# Patient Record
Sex: Male | Born: 1975 | Race: White | Hispanic: No | Marital: Single
Health system: Southern US, Community
[De-identification: ages and names within clinical notes are randomized; demographics above are authoritative.]

---

## 2007-12-12 ENCOUNTER — Ambulatory Visit: Payer: Self-pay | Admitting: Cardiology

## 2007-12-18 ENCOUNTER — Ambulatory Visit: Payer: Self-pay

## 2007-12-18 ENCOUNTER — Ambulatory Visit: Payer: Self-pay | Admitting: Cardiology

## 2011-02-27 NOTE — Procedures (Signed)
[name]                              EXERCISE TREADMILL   NAME:[name], [name]                          MRN:          [id number]  DATE:12/18/2007                            DOB:          [birth year]    [name] is a 35 year old gentleman I recently evaluated for atypical  chest pain.  This study is performed for risk stratification.   The patient exercised for a duration of 18 minutes and 7 seconds on the  [name] protocol, which is equivalent to 20.5 METs.  His heart rate  increased from a resting of 55 to a maximum of 179, which is 94% of his  predicted maximum.  His blood pressure rest is 126/78 and increased to  189/104, which is felt to be a hypertensive response.  The patient had  brief chest pain while walking, but while running  the pain resolved and  he had no further symptoms.  There were no electrocardiographic changes.  The study was terminated secondary to fatigue.   FINAL INTERPRETATION:  The exercise treadmill with mild chest discomfort  in the walking stages but no chest pain at peak exertion.  There were no  electrocardiographic changes.  The patient demonstrated excellent  exercise tolerance.  Of note, there was a hypertensive response.  This  would suggest that his pain is not cardiac and would be a low risk  study.     Madolyn Frieze Jens Som, MD, [facility]  Electronically Signed    BSC/MedQ  DD: 12/18/2007  DT: 12/18/2007  Job #: [id number]   cc:   Kristian Covey, PAC

## 2011-02-27 NOTE — Assessment & Plan Note (Signed)
[facility]                            CARDIOLOGY OFFICE NOTE   NAME:Roman, Roman                          MRN:          [id number]  DATE:12/12/2007                            DOB:          12/02/1975    HISTORY:  The patient is a 35 year old male who I am asked to evaluate  for chest pain.  He has no prior cardiac history.  He is in excellent  shape and typically exercises 2-3 times per week.  He also coaches  soccer and track.  He typically does not have dyspnea on exertion,  orthopnea, PND, pedal edema, palpitations, presyncope, syncope or chest  pain.  On the 15th of this month the patient participated in a 6 mile  run.  During that run he developed a cramping sensation in the left  axillary/lateral chest area.  The pain was not positional nor is it  pleuritic.  It was not related to food.  It is not clearly exertional.  The pain has been intermittent since that time.  He does state that he  ran around his track at school for half lap and the pain returned.  However, recently he ran through his neighborhood and had no pain.  He  does state that for the past 2 days it has been almost persistent to a  mild degree.  He feels that something is there.  There is no  associated nausea, vomiting, shortness of breath or diaphoresis.  The  pain does not radiate.  Again, he describes it as a cramping sensation.  Because of the above we were asked to further evaluate.   MEDICATIONS:  He is on no medications.   ALLERGIES:  He has no known drug allergies.   SOCIAL HISTORY:  He does not smoke.  He occasionally consumes alcohol.  He denies any cocaine use.   FAMILY HISTORY:  Is significant for WPW.  He also states his sister has  PACs.  There is no coronary artery disease in the immediate family.   PAST MEDICAL HISTORY:  There is no diabetes mellitus, hypertension or  hyperlipidemia.  There are no other medical issues noted.  There are no  surgeries.   REVIEW OF SYSTEMS:  He denies any headaches or fevers or chills.  There  is no productive cough or hemoptysis.  There is no dysphagia,  odynophagia, melena or hematochezia.  There is no dysuria or hematuria.  There is no rash or seizure active.  There is no orthopnea, PND or pedal  edema.  The remaining systems are negative.   PHYSICAL EXAMINATION:  VITAL SIGNS:  Today shows a blood pressure of  129/86.  His pulse is 62.  He weighs 136 pounds.  GENERAL:  He is well-developed and well-nourished and in no acute  distress.  SKIN:  Skin is warm and dry.  He does not appear to be depressed.  There  is no peripheral clubbing.  BACK:  Normal.  HEENT:  Is normal with normal eyelids.  NECK:  Neck is supple with normal upstroke bilaterally.  No bruits  noted.  There is no jugular venous distention.  No thyromegaly.  CHEST:  His chest is clear to auscultation with normal expansion.  CARDIOVASCULAR:  Regular rate and rhythm.  Normal S1 and S2.  There are  no murmurs, rubs or gallops noted.  He does have a tattoo on his right  shoulder and right back.  ABDOMEN:  Nontender, nondistended.  Positive bowel sounds.  No  hepatosplenomegaly and no masses appreciated.  There is no abdominal  bruit.  He has 2+ femoral pulses bilaterally.  No bruits.  EXTREMITIES:  Show no edema.  I could palpate no cords.  He has 2+  dorsalis pedis pulses bilaterally.  NEUROLOGICAL:  Grossly intact.   I have an electrocardiogram from December 04, 2007, that showed sinus  bradycardia with no ST changes.  Today's electrocardiogram shows sinus  rhythm at a rate of 63.  The axis is normal.  There are no ST changes  noted.   DIAGNOSES:  1. Atypical chest pain - [name] presents for evaluation of chest      pain.  His symptoms are very atypical and may be musculoskeletal in      etiology.  However, he is very concerned about these.  We will plan      to proceed with an exercise treadmill for evaluation.  I think if       this is unremarkable then we would not need to pursue further      ischemia evaluation.  If his symptoms persist it may be worthwhile      to try a course of nonsteroidals.  Regardless, I have asked him to      follow up with his primary care physician for further evaluation if      the symptoms persist.  2. Risk factor modification - the patient does not smoke.  We have      discussed the importance of diet and exercise and he is already      doing well with this.  We will see him back on an as-needed basis.     Madolyn Frieze Jens Som, MD, [facility]  Electronically Signed    BSC/MedQ  DD: 12/12/2007  DT: 12/12/2007  Job #: [id number]   cc:   Kristian Covey, PAC

## 2011-05-21 ENCOUNTER — Encounter (INDEPENDENT_AMBULATORY_CARE_PROVIDER_SITE_OTHER): Payer: BC Managed Care – PPO | Admitting: Ophthalmology

## 2011-05-21 DIAGNOSIS — D3130 Benign neoplasm of unspecified choroid: Secondary | ICD-10-CM

## 2011-05-21 DIAGNOSIS — H43819 Vitreous degeneration, unspecified eye: Secondary | ICD-10-CM

## 2011-11-19 ENCOUNTER — Ambulatory Visit (INDEPENDENT_AMBULATORY_CARE_PROVIDER_SITE_OTHER): Payer: BC Managed Care – PPO | Admitting: Ophthalmology

## 2011-11-19 DIAGNOSIS — H5210 Myopia, unspecified eye: Secondary | ICD-10-CM

## 2011-11-19 DIAGNOSIS — D3130 Benign neoplasm of unspecified choroid: Secondary | ICD-10-CM

## 2011-11-19 DIAGNOSIS — H43819 Vitreous degeneration, unspecified eye: Secondary | ICD-10-CM

## 2012-05-19 ENCOUNTER — Ambulatory Visit (INDEPENDENT_AMBULATORY_CARE_PROVIDER_SITE_OTHER): Payer: BC Managed Care – PPO | Admitting: Ophthalmology

## 2012-05-19 DIAGNOSIS — H43819 Vitreous degeneration, unspecified eye: Secondary | ICD-10-CM

## 2012-05-19 DIAGNOSIS — D3130 Benign neoplasm of unspecified choroid: Secondary | ICD-10-CM

## 2012-11-19 ENCOUNTER — Ambulatory Visit (INDEPENDENT_AMBULATORY_CARE_PROVIDER_SITE_OTHER): Payer: BC Managed Care – PPO | Admitting: Ophthalmology

## 2012-11-19 DIAGNOSIS — H5210 Myopia, unspecified eye: Secondary | ICD-10-CM

## 2012-11-19 DIAGNOSIS — D3130 Benign neoplasm of unspecified choroid: Secondary | ICD-10-CM

## 2012-11-19 DIAGNOSIS — H43819 Vitreous degeneration, unspecified eye: Secondary | ICD-10-CM

## 2013-08-19 ENCOUNTER — Ambulatory Visit (INDEPENDENT_AMBULATORY_CARE_PROVIDER_SITE_OTHER): Payer: BC Managed Care – PPO | Admitting: Ophthalmology

## 2013-08-19 DIAGNOSIS — H43819 Vitreous degeneration, unspecified eye: Secondary | ICD-10-CM

## 2013-08-19 DIAGNOSIS — D3130 Benign neoplasm of unspecified choroid: Secondary | ICD-10-CM

## 2014-05-19 ENCOUNTER — Ambulatory Visit (INDEPENDENT_AMBULATORY_CARE_PROVIDER_SITE_OTHER): Payer: BC Managed Care – PPO | Admitting: Ophthalmology

## 2014-05-19 DIAGNOSIS — D3130 Benign neoplasm of unspecified choroid: Secondary | ICD-10-CM

## 2014-05-19 DIAGNOSIS — H5210 Myopia, unspecified eye: Secondary | ICD-10-CM

## 2014-05-19 DIAGNOSIS — H43819 Vitreous degeneration, unspecified eye: Secondary | ICD-10-CM

## 2014-05-19 DIAGNOSIS — H35369 Drusen (degenerative) of macula, unspecified eye: Secondary | ICD-10-CM

## 2015-02-23 ENCOUNTER — Ambulatory Visit (INDEPENDENT_AMBULATORY_CARE_PROVIDER_SITE_OTHER): Payer: BC Managed Care – PPO | Admitting: Ophthalmology

## 2015-02-23 DIAGNOSIS — D3132 Benign neoplasm of left choroid: Secondary | ICD-10-CM

## 2015-02-23 DIAGNOSIS — H43813 Vitreous degeneration, bilateral: Secondary | ICD-10-CM

## 2015-11-28 ENCOUNTER — Ambulatory Visit (INDEPENDENT_AMBULATORY_CARE_PROVIDER_SITE_OTHER): Payer: BC Managed Care – PPO | Admitting: Ophthalmology

## 2015-11-28 DIAGNOSIS — D3132 Benign neoplasm of left choroid: Secondary | ICD-10-CM | POA: Diagnosis not present

## 2015-11-28 DIAGNOSIS — H43813 Vitreous degeneration, bilateral: Secondary | ICD-10-CM

## 2016-08-27 ENCOUNTER — Ambulatory Visit (INDEPENDENT_AMBULATORY_CARE_PROVIDER_SITE_OTHER): Payer: BC Managed Care – PPO | Admitting: Ophthalmology

## 2016-08-27 DIAGNOSIS — H2512 Age-related nuclear cataract, left eye: Secondary | ICD-10-CM | POA: Diagnosis not present

## 2016-08-27 DIAGNOSIS — H35721 Serous detachment of retinal pigment epithelium, right eye: Secondary | ICD-10-CM | POA: Diagnosis not present

## 2016-08-27 DIAGNOSIS — H43813 Vitreous degeneration, bilateral: Secondary | ICD-10-CM | POA: Diagnosis not present

## 2016-08-27 DIAGNOSIS — D3132 Benign neoplasm of left choroid: Secondary | ICD-10-CM

## 2017-05-27 ENCOUNTER — Ambulatory Visit (INDEPENDENT_AMBULATORY_CARE_PROVIDER_SITE_OTHER): Payer: BC Managed Care – PPO | Admitting: Ophthalmology

## 2017-05-27 DIAGNOSIS — H5213 Myopia, bilateral: Secondary | ICD-10-CM

## 2017-05-27 DIAGNOSIS — D3132 Benign neoplasm of left choroid: Secondary | ICD-10-CM

## 2017-05-27 DIAGNOSIS — H43813 Vitreous degeneration, bilateral: Secondary | ICD-10-CM

## 2017-05-27 DIAGNOSIS — H35721 Serous detachment of retinal pigment epithelium, right eye: Secondary | ICD-10-CM

## 2018-01-29 ENCOUNTER — Other Ambulatory Visit: Payer: Self-pay | Admitting: Family Medicine

## 2018-01-29 DIAGNOSIS — M25551 Pain in right hip: Secondary | ICD-10-CM

## 2018-01-31 ENCOUNTER — Other Ambulatory Visit: Payer: BC Managed Care – PPO

## 2018-02-03 ENCOUNTER — Other Ambulatory Visit: Payer: BC Managed Care – PPO

## 2018-02-05 ENCOUNTER — Ambulatory Visit
Admission: RE | Admit: 2018-02-05 | Discharge: 2018-02-05 | Disposition: A | Discharge status: Home / Self Care | Payer: BC Managed Care – PPO | Source: Ambulatory Visit | Attending: Family Medicine | Admitting: Family Medicine

## 2018-02-05 DIAGNOSIS — M25551 Pain in right hip: Secondary | ICD-10-CM

## 2018-02-24 ENCOUNTER — Encounter (INDEPENDENT_AMBULATORY_CARE_PROVIDER_SITE_OTHER): Payer: BC Managed Care – PPO | Admitting: Ophthalmology

## 2018-02-24 DIAGNOSIS — H5213 Myopia, bilateral: Secondary | ICD-10-CM

## 2018-02-24 DIAGNOSIS — H43813 Vitreous degeneration, bilateral: Secondary | ICD-10-CM

## 2018-02-24 DIAGNOSIS — D3132 Benign neoplasm of left choroid: Secondary | ICD-10-CM | POA: Diagnosis not present

## 2018-09-11 ENCOUNTER — Encounter (HOSPITAL_COMMUNITY): Payer: Self-pay | Admitting: Emergency Medicine

## 2018-09-11 ENCOUNTER — Other Ambulatory Visit: Payer: Self-pay

## 2018-09-11 ENCOUNTER — Emergency Department (HOSPITAL_COMMUNITY): Payer: BC Managed Care – PPO

## 2018-09-11 ENCOUNTER — Emergency Department (HOSPITAL_COMMUNITY)
Admission: EM | Admit: 2018-09-11 | Discharge: 2018-09-11 | Disposition: A | Discharge status: Home / Self Care | Payer: BC Managed Care – PPO | Attending: Emergency Medicine | Admitting: Emergency Medicine

## 2018-09-11 DIAGNOSIS — Y9389 Activity, other specified: Secondary | ICD-10-CM | POA: Insufficient documentation

## 2018-09-11 DIAGNOSIS — R718 Other abnormality of red blood cells: Secondary | ICD-10-CM | POA: Insufficient documentation

## 2018-09-11 DIAGNOSIS — W228XXA Striking against or struck by other objects, initial encounter: Secondary | ICD-10-CM | POA: Diagnosis not present

## 2018-09-11 DIAGNOSIS — S0990XA Unspecified injury of head, initial encounter: Secondary | ICD-10-CM | POA: Diagnosis present

## 2018-09-11 DIAGNOSIS — Y999 Unspecified external cause status: Secondary | ICD-10-CM | POA: Diagnosis not present

## 2018-09-11 DIAGNOSIS — R51 Headache: Secondary | ICD-10-CM | POA: Diagnosis not present

## 2018-09-11 DIAGNOSIS — Y929 Unspecified place or not applicable: Secondary | ICD-10-CM | POA: Insufficient documentation

## 2018-09-11 DIAGNOSIS — F0781 Postconcussional syndrome: Secondary | ICD-10-CM | POA: Diagnosis not present

## 2018-09-11 DIAGNOSIS — R799 Abnormal finding of blood chemistry, unspecified: Secondary | ICD-10-CM

## 2018-09-11 LAB — CBC WITH DIFFERENTIAL/PLATELET
ABS IMMATURE GRANULOCYTES: 0.02 10*3/uL (ref 0.00–0.07)
BASOS PCT: 1 %
Basophils Absolute: 0.1 10*3/uL (ref 0.0–0.1)
EOS ABS: 0.2 10*3/uL (ref 0.0–0.5)
Eosinophils Relative: 3 %
HEMATOCRIT: 46.9 % (ref 39.0–52.0)
HEMOGLOBIN: 14.4 g/dL (ref 13.0–17.0)
Immature Granulocytes: 0 %
Lymphocytes Relative: 30 %
Lymphs Abs: 1.8 10*3/uL (ref 0.7–4.0)
MCH: 19.7 pg — AB (ref 26.0–34.0)
MCHC: 30.7 g/dL (ref 30.0–36.0)
MCV: 64.2 fL — ABNORMAL LOW (ref 80.0–100.0)
Monocytes Absolute: 0.6 10*3/uL (ref 0.1–1.0)
Monocytes Relative: 10 %
NEUTROS PCT: 56 %
Neutro Abs: 3.3 10*3/uL (ref 1.7–7.7)
Platelets: 229 10*3/uL (ref 150–400)
RBC: 7.3 MIL/uL — ABNORMAL HIGH (ref 4.22–5.81)
RDW: 16.7 % — ABNORMAL HIGH (ref 11.5–15.5)
WBC: 5.9 10*3/uL (ref 4.0–10.5)
nRBC: 0 % (ref 0.0–0.2)

## 2018-09-11 LAB — COMPREHENSIVE METABOLIC PANEL
ALBUMIN: 5.1 g/dL — AB (ref 3.5–5.0)
ALK PHOS: 43 U/L (ref 38–126)
ALT: 26 U/L (ref 0–44)
AST: 31 U/L (ref 15–41)
Anion gap: 7 (ref 5–15)
BUN: 15 mg/dL (ref 6–20)
CALCIUM: 9.9 mg/dL (ref 8.9–10.3)
CO2: 29 mmol/L (ref 22–32)
CREATININE: 1.07 mg/dL (ref 0.61–1.24)
Chloride: 104 mmol/L (ref 98–111)
GFR calc non Af Amer: >60 mL/min (ref >60)
GLUCOSE: 109 mg/dL — AB (ref 70–99)
Potassium: 3.9 mmol/L (ref 3.5–5.1)
SODIUM: 140 mmol/L (ref 135–145)
TOTAL PROTEIN: 7.4 g/dL (ref 6.5–8.1)
Total Bilirubin: 1.1 mg/dL (ref 0.3–1.2)

## 2018-09-11 LAB — TECHNOLOGIST SMEAR REVIEW

## 2018-09-11 MED ORDER — MECLIZINE HCL 25 MG PO TABS: 25.0000 mg | ORAL_TABLET | Freq: Three times a day (TID) | ORAL | 0 refills | Status: AC | PRN

## 2018-09-11 NOTE — ED Notes (Signed)
Lab to add on latest order for "technologist smear review"
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
## 2018-09-11 NOTE — ED Notes (Signed)
Patient transported to CT 

## 2018-09-11 NOTE — Discharge Instructions (Addendum)
Try meclizine for dizziness as prescribed as needed. Drink plenty of fluids. Rest. Follow up with primary care doctor in 1 week.

## 2018-09-11 NOTE — ED Notes (Signed)
ED Provider at bedside. 
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
## 2018-12-01 ENCOUNTER — Encounter (INDEPENDENT_AMBULATORY_CARE_PROVIDER_SITE_OTHER): Payer: BC Managed Care – PPO | Admitting: Ophthalmology

## 2018-12-01 DIAGNOSIS — H5213 Myopia, bilateral: Secondary | ICD-10-CM

## 2018-12-01 DIAGNOSIS — D3132 Benign neoplasm of left choroid: Secondary | ICD-10-CM | POA: Diagnosis not present

## 2018-12-01 DIAGNOSIS — H2513 Age-related nuclear cataract, bilateral: Secondary | ICD-10-CM | POA: Diagnosis not present

## 2018-12-01 DIAGNOSIS — H43813 Vitreous degeneration, bilateral: Secondary | ICD-10-CM | POA: Diagnosis not present

## 2019-09-02 ENCOUNTER — Encounter (INDEPENDENT_AMBULATORY_CARE_PROVIDER_SITE_OTHER): Payer: BC Managed Care – PPO | Admitting: Ophthalmology

## 2019-09-02 ENCOUNTER — Other Ambulatory Visit: Payer: Self-pay

## 2019-09-02 DIAGNOSIS — D3132 Benign neoplasm of left choroid: Secondary | ICD-10-CM

## 2019-09-02 DIAGNOSIS — H353112 Nonexudative age-related macular degeneration, right eye, intermediate dry stage: Secondary | ICD-10-CM | POA: Diagnosis not present

## 2019-09-02 DIAGNOSIS — H43813 Vitreous degeneration, bilateral: Secondary | ICD-10-CM

## 2020-01-12 IMAGING — CT CT HEAD W/O CM
4 series · 16 of 47 positions shown, 18 images · non-contrast
Comparison: None.

CLINICAL DATA: Head injury. ETOH. headache.

EXAM:
CT HEAD WITHOUT CONTRAST
TECHNIQUE: Contiguous axial images were obtained from the base of the skull
through the vertex without intravenous contrast.

[Series 3: head wo · axial · 0.43mm/px · z∈[-126,-6]mm · 7 of 33 slices shown, 9 images]
[im 5/33  brain]
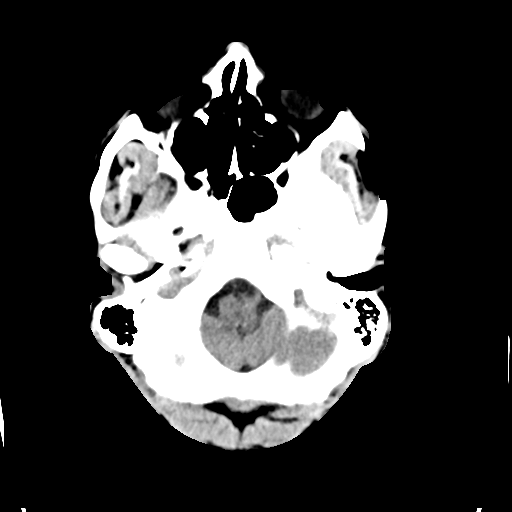
[im 5/33  bone]
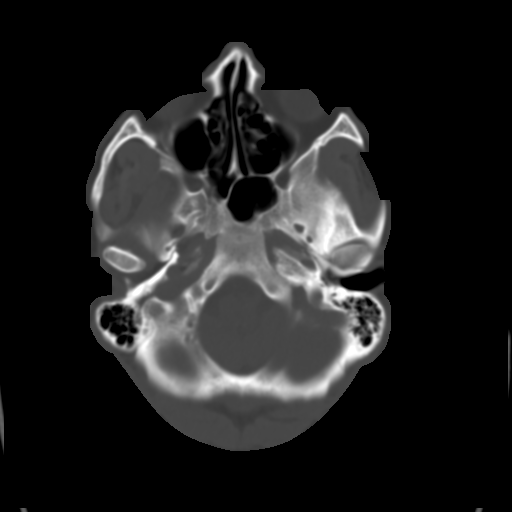
[im 9/33  brain]
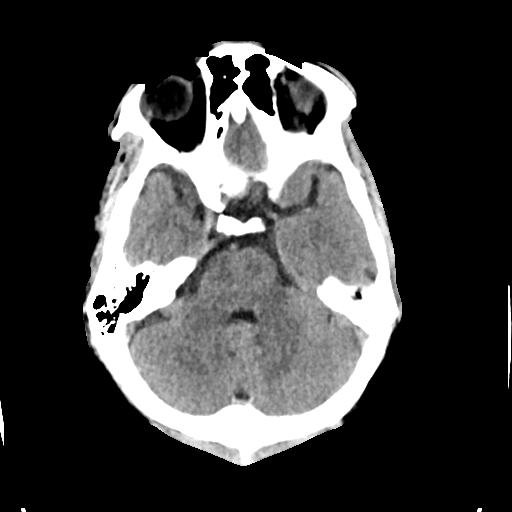
[im 13/33  brain]
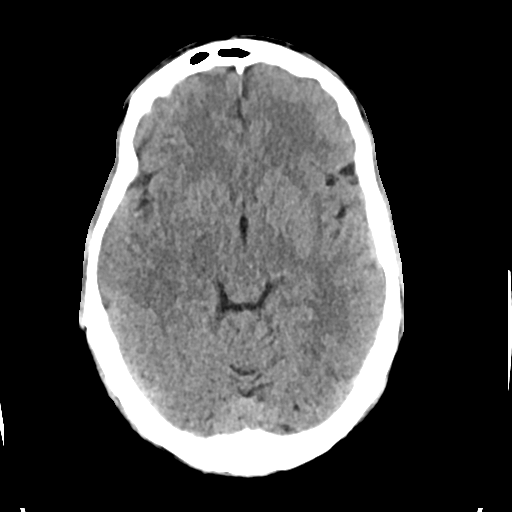
[im 17/33  brain]
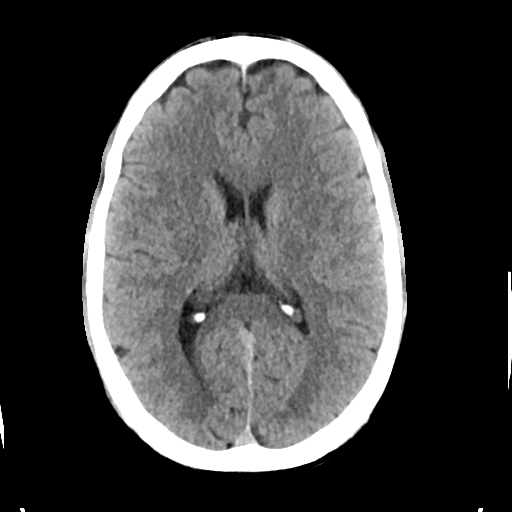
[im 21/33  brain]
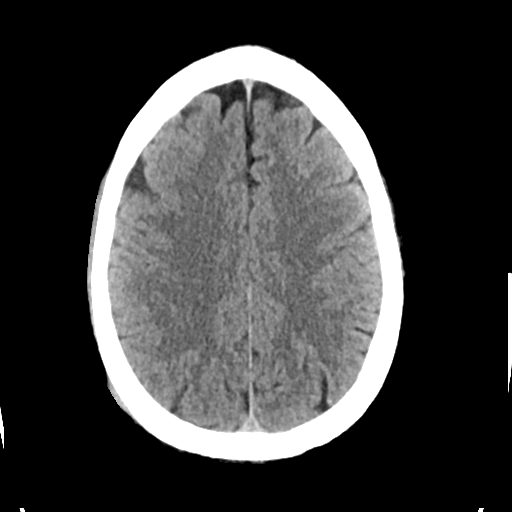
[im 21/33  bone]
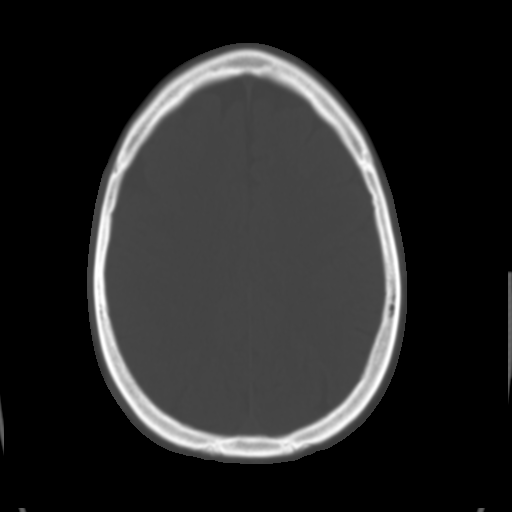
[im 25/33  brain]
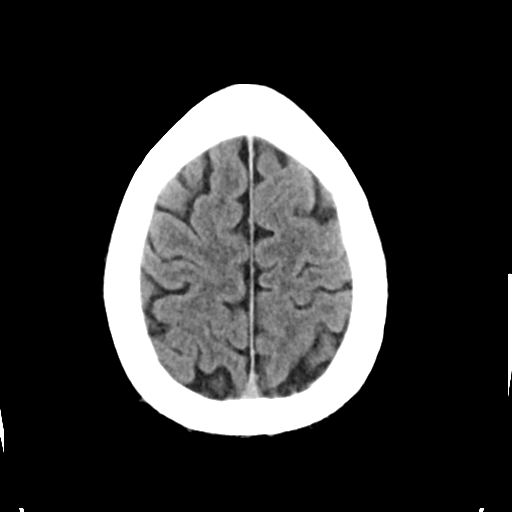
[im 29/33  brain]
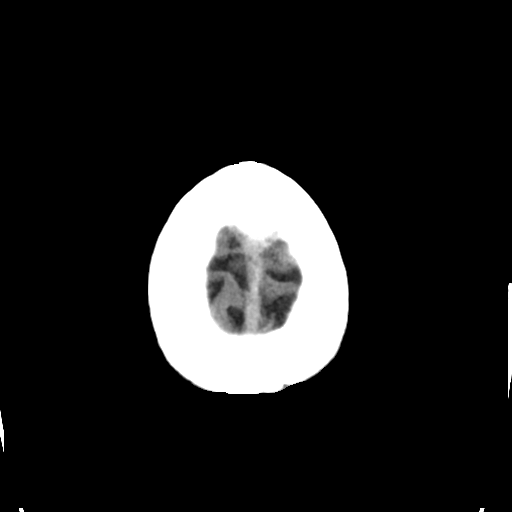

[Series 4: head bone · axial · 0.43mm/px · z∈[-130,-98]mm · 3 of 83 slices shown]
[im 9/83  bone]
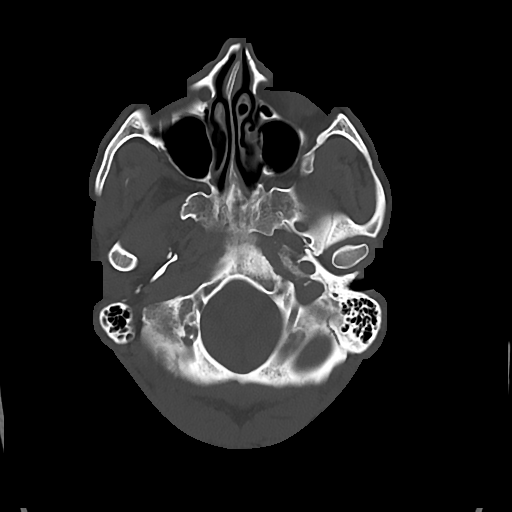
[im 17/83  bone]
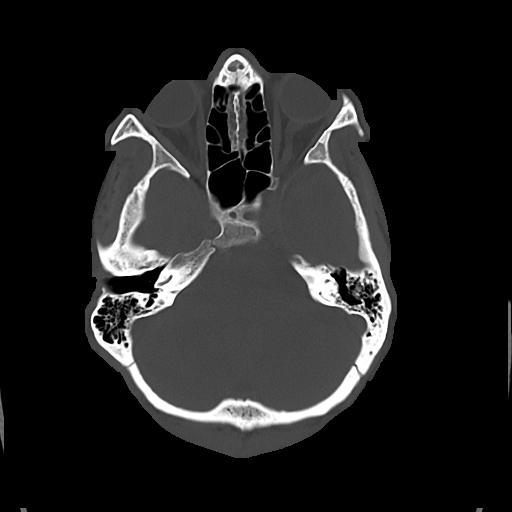
[im 25/83  bone]
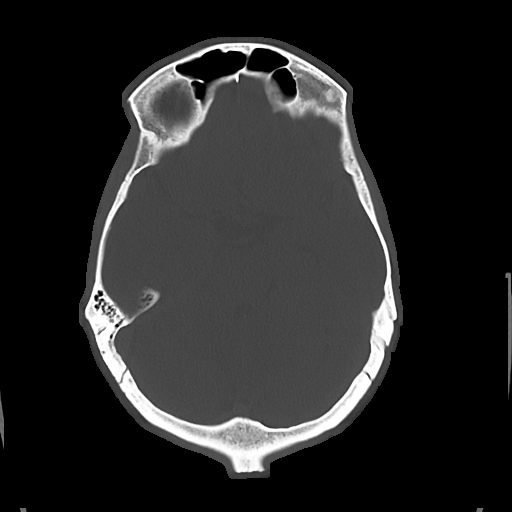

[Series 5: cor soft · coronal · 0.32mm/px · 3 of 67 slices shown]
[im 23/67  brain]
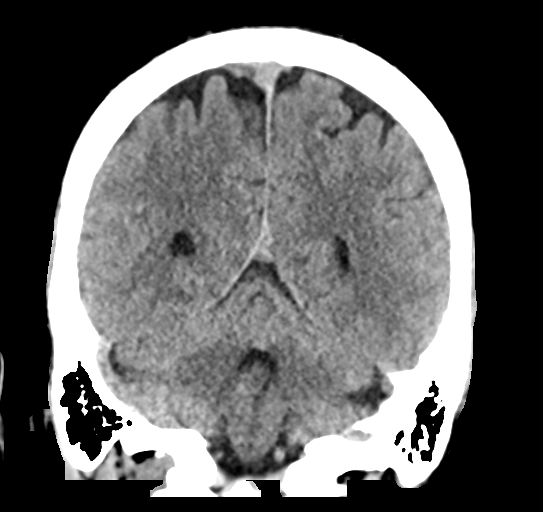
[im 30/67  brain]
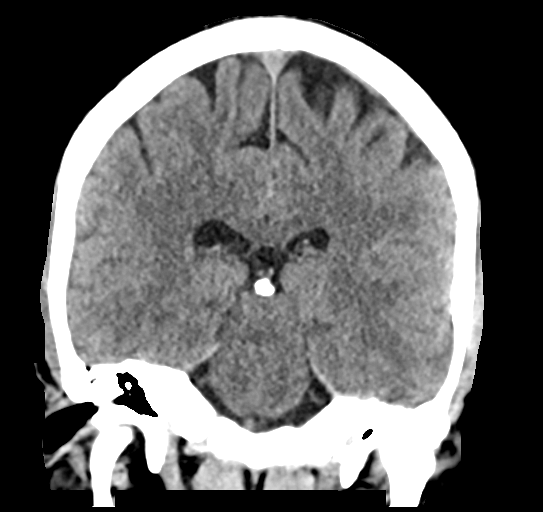
[im 37/67  brain]
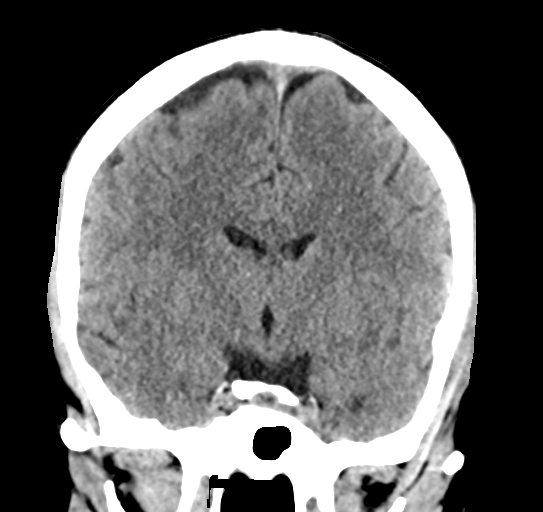

[Series 6: sag soft · sagittal · 0.32mm/px · 3 of 57 slices shown]
[im 19/57  brain]
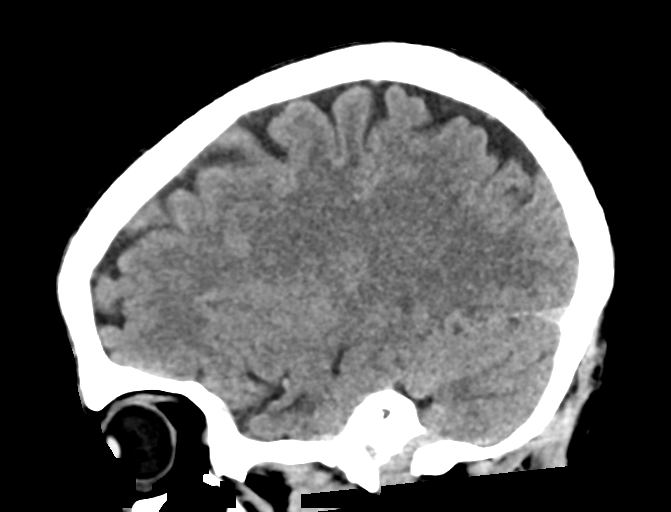
[im 29/57  brain]
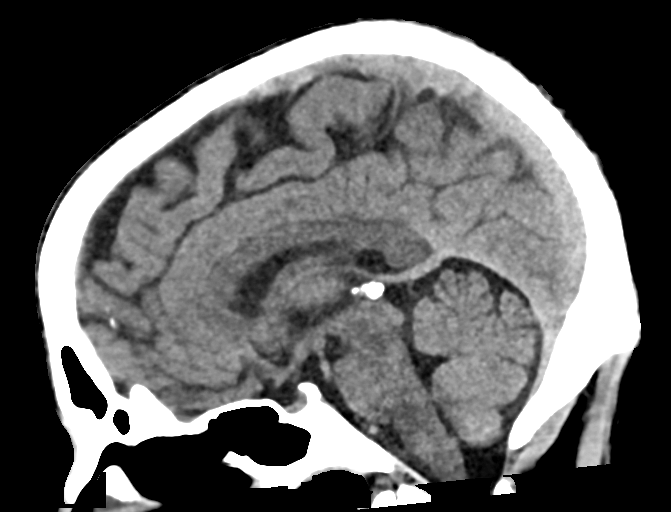
[im 38/57  brain]
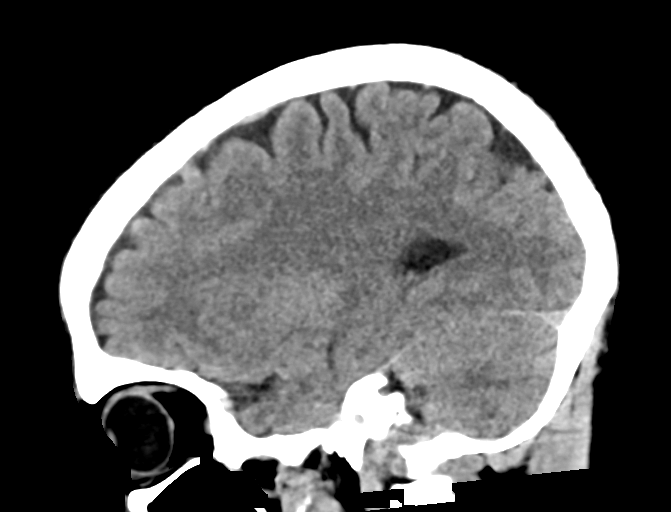

[16 of 47 positions shown; findings below may reference images not displayed]

FINDINGS: Brain: Ventricles are normal in size and configuration. There is no
mass, hemorrhage, edema or other evidence of acute parenchymal
abnormality. No extra-axial hemorrhage.

Vascular: No hyperdense vessel or unexpected calcification.

Skull: Normal. Negative for fracture or focal lesion.

Sinuses/Orbits: No acute finding.

Other: Focal scalp edema overlying the RIGHT posterior
parietal-occipital bone. No underlying fracture.
IMPRESSION: 1. Focal scalp edema overlying the RIGHT posterior
parietal-occipital bone. No underlying fracture.
2. No acute intracranial abnormality. No intracranial hemorrhage or
edema.

## 2020-05-25 ENCOUNTER — Other Ambulatory Visit: Payer: Self-pay

## 2020-05-25 ENCOUNTER — Encounter (INDEPENDENT_AMBULATORY_CARE_PROVIDER_SITE_OTHER): Payer: BC Managed Care – PPO | Admitting: Ophthalmology

## 2020-05-25 DIAGNOSIS — H2511 Age-related nuclear cataract, right eye: Secondary | ICD-10-CM | POA: Diagnosis not present

## 2020-05-25 DIAGNOSIS — D3132 Benign neoplasm of left choroid: Secondary | ICD-10-CM | POA: Diagnosis not present

## 2020-05-25 DIAGNOSIS — H43813 Vitreous degeneration, bilateral: Secondary | ICD-10-CM | POA: Diagnosis not present

## 2020-05-25 DIAGNOSIS — H353112 Nonexudative age-related macular degeneration, right eye, intermediate dry stage: Secondary | ICD-10-CM

## 2021-02-14 ENCOUNTER — Encounter (INDEPENDENT_AMBULATORY_CARE_PROVIDER_SITE_OTHER): Payer: BC Managed Care – PPO | Admitting: Ophthalmology

## 2021-02-22 ENCOUNTER — Other Ambulatory Visit: Payer: Self-pay

## 2021-02-22 ENCOUNTER — Encounter (INDEPENDENT_AMBULATORY_CARE_PROVIDER_SITE_OTHER): Payer: BC Managed Care – PPO | Admitting: Ophthalmology

## 2021-02-22 DIAGNOSIS — H353112 Nonexudative age-related macular degeneration, right eye, intermediate dry stage: Secondary | ICD-10-CM | POA: Diagnosis not present

## 2021-02-22 DIAGNOSIS — H353121 Nonexudative age-related macular degeneration, left eye, early dry stage: Secondary | ICD-10-CM | POA: Diagnosis not present

## 2021-02-22 DIAGNOSIS — D3132 Benign neoplasm of left choroid: Secondary | ICD-10-CM

## 2021-02-22 DIAGNOSIS — H43813 Vitreous degeneration, bilateral: Secondary | ICD-10-CM

## 2021-02-22 DIAGNOSIS — H2513 Age-related nuclear cataract, bilateral: Secondary | ICD-10-CM

## 2021-11-27 ENCOUNTER — Encounter (INDEPENDENT_AMBULATORY_CARE_PROVIDER_SITE_OTHER): Payer: BC Managed Care – PPO | Admitting: Ophthalmology

## 2021-12-04 ENCOUNTER — Other Ambulatory Visit: Payer: Self-pay

## 2021-12-04 ENCOUNTER — Encounter (INDEPENDENT_AMBULATORY_CARE_PROVIDER_SITE_OTHER): Payer: BC Managed Care – PPO | Admitting: Ophthalmology

## 2021-12-04 DIAGNOSIS — H43813 Vitreous degeneration, bilateral: Secondary | ICD-10-CM

## 2021-12-04 DIAGNOSIS — D3132 Benign neoplasm of left choroid: Secondary | ICD-10-CM

## 2021-12-04 DIAGNOSIS — H353131 Nonexudative age-related macular degeneration, bilateral, early dry stage: Secondary | ICD-10-CM

## 2021-12-04 DIAGNOSIS — H2513 Age-related nuclear cataract, bilateral: Secondary | ICD-10-CM | POA: Diagnosis not present

## 2022-09-05 ENCOUNTER — Encounter (INDEPENDENT_AMBULATORY_CARE_PROVIDER_SITE_OTHER): Payer: BC Managed Care – PPO | Admitting: Ophthalmology

## 2022-09-05 DIAGNOSIS — H353131 Nonexudative age-related macular degeneration, bilateral, early dry stage: Secondary | ICD-10-CM | POA: Diagnosis not present

## 2022-09-05 DIAGNOSIS — D3132 Benign neoplasm of left choroid: Secondary | ICD-10-CM | POA: Diagnosis not present

## 2022-09-05 DIAGNOSIS — H43813 Vitreous degeneration, bilateral: Secondary | ICD-10-CM

## 2023-05-22 ENCOUNTER — Encounter (INDEPENDENT_AMBULATORY_CARE_PROVIDER_SITE_OTHER): Payer: BC Managed Care – PPO | Admitting: Ophthalmology

## 2023-05-22 DIAGNOSIS — H353132 Nonexudative age-related macular degeneration, bilateral, intermediate dry stage: Secondary | ICD-10-CM | POA: Diagnosis not present

## 2023-05-22 DIAGNOSIS — H43813 Vitreous degeneration, bilateral: Secondary | ICD-10-CM | POA: Diagnosis not present

## 2023-05-22 DIAGNOSIS — D3132 Benign neoplasm of left choroid: Secondary | ICD-10-CM

## 2023-05-24 ENCOUNTER — Institutional Professional Consult (permissible substitution) (INDEPENDENT_AMBULATORY_CARE_PROVIDER_SITE_OTHER): Payer: BC Managed Care – PPO | Admitting: Otolaryngology

## 2024-03-30 ENCOUNTER — Encounter (INDEPENDENT_AMBULATORY_CARE_PROVIDER_SITE_OTHER): Payer: BC Managed Care – PPO | Admitting: Ophthalmology

## 2024-03-30 DIAGNOSIS — H43813 Vitreous degeneration, bilateral: Secondary | ICD-10-CM

## 2024-03-30 DIAGNOSIS — D3132 Benign neoplasm of left choroid: Secondary | ICD-10-CM

## 2024-03-30 DIAGNOSIS — H353131 Nonexudative age-related macular degeneration, bilateral, early dry stage: Secondary | ICD-10-CM

## 2024-04-07 ENCOUNTER — Inpatient Hospital Stay: Payer: Self-pay

## 2024-04-07 ENCOUNTER — Inpatient Hospital Stay: Payer: Self-pay | Admitting: Genetic Counselor

## 2024-05-29 ENCOUNTER — Telehealth: Payer: Self-pay | Admitting: Genetic Counselor

## 2024-05-29 NOTE — Telephone Encounter (Signed)
 Scheduled appointments per incoming call for a self referral for genetics. Talked with the patient and he is aware of the made appointments.

## 2024-06-29 ENCOUNTER — Other Ambulatory Visit: Payer: Self-pay | Admitting: Medical Genetics

## 2024-07-01 ENCOUNTER — Other Ambulatory Visit (HOSPITAL_COMMUNITY)
Admission: RE | Admit: 2024-07-01 | Discharge: 2024-07-01 | Disposition: A | Discharge status: Home / Self Care | Payer: Self-pay | Source: Ambulatory Visit | Attending: Oncology | Admitting: Oncology

## 2024-07-22 ENCOUNTER — Encounter: Payer: Self-pay | Admitting: Genetic Counselor

## 2024-07-22 ENCOUNTER — Other Ambulatory Visit: Payer: Self-pay

## 2024-07-27 LAB — GENECONNECT MOLECULAR SCREEN: Genetic Analysis Overall Interpretation: POSITIVE — AB

## 2024-07-28 ENCOUNTER — Telehealth: Payer: Self-pay | Admitting: Medical Genetics

## 2024-07-29 ENCOUNTER — Telehealth: Payer: Self-pay | Admitting: Medical Genetics

## 2024-07-29 DIAGNOSIS — Z15068 Genetic susceptibility to other malignant neoplasm of digestive system: Secondary | ICD-10-CM

## 2024-07-29 NOTE — Telephone Encounter (Signed)
 [facility] Positive Result Note 07/29/2024 4:18 PM  SECOND ATTEMPT: Confirmed I was speaking with [name]. [id number] by using name and DOB. Informed participant the reason for this call is to provide results for the above study. Results revealed Hereditary Breast and Ovarian Syndrome. Genetic counseling was offered and participant requests a call back to schedule. All questions were answered, and participant was thanked for their time and support of the above study. Participant was encouraged to contact [facility] if they have any further questions or concerns.

## 2024-07-31 DIAGNOSIS — Z15068 Genetic susceptibility to other malignant neoplasm of digestive system: Secondary | ICD-10-CM | POA: Insufficient documentation

## 2024-07-31 NOTE — Telephone Encounter (Signed)
 See 07/29/2024 encounter for further details.

## 2024-12-28 ENCOUNTER — Encounter (INDEPENDENT_AMBULATORY_CARE_PROVIDER_SITE_OTHER): Admitting: Ophthalmology
